# Patient Record
Sex: Female | Born: 1985 | Race: Black or African American | Hispanic: No | Marital: Single | Smoking: Never smoker
Health system: Southern US, Community
[De-identification: ages and names within clinical notes are randomized; demographics above are authoritative.]

## PROBLEM LIST (undated history)

## (undated) DIAGNOSIS — I10 Essential (primary) hypertension: Secondary | ICD-10-CM

---
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
## 2006-01-24 ENCOUNTER — Emergency Department: Payer: Self-pay | Admitting: Emergency Medicine

## 2006-09-05 ENCOUNTER — Emergency Department: Payer: Self-pay | Admitting: Emergency Medicine

## 2006-09-13 ENCOUNTER — Emergency Department: Payer: Self-pay | Admitting: Emergency Medicine

## 2008-02-20 ENCOUNTER — Emergency Department: Payer: Self-pay

## 2013-06-22 ENCOUNTER — Emergency Department: Payer: Self-pay | Admitting: Emergency Medicine

## 2013-06-22 LAB — URINALYSIS, COMPLETE
BACTERIA: NONE SEEN
Bilirubin,UR: NEGATIVE
Blood: NEGATIVE
GLUCOSE, UR: NEGATIVE mg/dL (ref 0–75)
Ketone: NEGATIVE
NITRITE: NEGATIVE
Ph: 5 (ref 4.5–8.0)
Protein: NEGATIVE
RBC,UR: 1 /HPF (ref 0–5)
Specific Gravity: 1.024 (ref 1.003–1.030)

## 2013-06-22 LAB — COMPREHENSIVE METABOLIC PANEL
ALT: 14 U/L (ref 12–78)
Albumin: 2.9 g/dL — ABNORMAL LOW (ref 3.4–5.0)
Alkaline Phosphatase: 64 U/L
Anion Gap: 3 — ABNORMAL LOW (ref 7–16)
BUN: 11 mg/dL (ref 7–18)
Bilirubin,Total: 0.1 mg/dL — ABNORMAL LOW (ref 0.2–1.0)
CHLORIDE: 112 mmol/L — AB (ref 98–107)
CO2: 25 mmol/L (ref 21–32)
CREATININE: 0.8 mg/dL (ref 0.60–1.30)
Calcium, Total: 7.5 mg/dL — ABNORMAL LOW (ref 8.5–10.1)
EGFR (African American): >60
EGFR (Non-African Amer.): >60
Glucose: 91 mg/dL (ref 65–99)
Osmolality: 278 (ref 275–301)
Potassium: 3.8 mmol/L (ref 3.5–5.1)
SGOT(AST): 15 U/L (ref 15–37)
SODIUM: 140 mmol/L (ref 136–145)
Total Protein: 6.7 g/dL (ref 6.4–8.2)

## 2013-06-22 LAB — CBC WITH DIFFERENTIAL/PLATELET
BASOS PCT: 0.6 %
Basophil #: 0 10*3/uL (ref 0.0–0.1)
EOS ABS: 0.1 10*3/uL (ref 0.0–0.7)
Eosinophil %: 0.9 %
HCT: 33.1 % — ABNORMAL LOW (ref 35.0–47.0)
HGB: 10.7 g/dL — AB (ref 12.0–16.0)
Lymphocyte #: 1.7 10*3/uL (ref 1.0–3.6)
Lymphocyte %: 25.9 %
MCH: 27.3 pg (ref 26.0–34.0)
MCHC: 32.5 g/dL (ref 32.0–36.0)
MCV: 84 fL (ref 80–100)
MONO ABS: 0.6 x10 3/mm (ref 0.2–0.9)
MONOS PCT: 9.1 %
Neutrophil #: 4.2 10*3/uL (ref 1.4–6.5)
Neutrophil %: 63.5 %
PLATELETS: 160 10*3/uL (ref 150–440)
RBC: 3.93 10*6/uL (ref 3.80–5.20)
RDW: 15.1 % — ABNORMAL HIGH (ref 11.5–14.5)
WBC: 6.7 10*3/uL (ref 3.6–11.0)

## 2013-06-22 LAB — HCG, QUANTITATIVE, PREGNANCY: BETA HCG, QUANT.: 4601 m[IU]/mL — AB

## 2014-01-10 ENCOUNTER — Observation Stay: Payer: Self-pay

## 2014-01-11 ENCOUNTER — Observation Stay: Payer: Self-pay

## 2014-02-13 ENCOUNTER — Inpatient Hospital Stay: Payer: Self-pay

## 2014-02-13 LAB — CBC WITH DIFFERENTIAL/PLATELET
Basophil #: 0.1 10*3/uL (ref 0.0–0.1)
Basophil %: 1 %
EOS ABS: 0.1 10*3/uL (ref 0.0–0.7)
EOS PCT: 0.9 %
HCT: 36.5 % (ref 35.0–47.0)
HGB: 11.9 g/dL — AB (ref 12.0–16.0)
LYMPHS ABS: 1.7 10*3/uL (ref 1.0–3.6)
Lymphocyte %: 23.1 %
MCH: 28.8 pg (ref 26.0–34.0)
MCHC: 32.5 g/dL (ref 32.0–36.0)
MCV: 89 fL (ref 80–100)
MONO ABS: 0.5 x10 3/mm (ref 0.2–0.9)
Monocyte %: 7.4 %
Neutrophil #: 5 10*3/uL (ref 1.4–6.5)
Neutrophil %: 67.6 %
Platelet: 143 10*3/uL — ABNORMAL LOW (ref 150–440)
RBC: 4.12 10*6/uL (ref 3.80–5.20)
RDW: 13.2 % (ref 11.5–14.5)
WBC: 7.3 10*3/uL (ref 3.6–11.0)

## 2014-02-14 LAB — BASIC METABOLIC PANEL
ANION GAP: 8 (ref 7–16)
BUN: 16 mg/dL (ref 7–18)
CO2: 23 mmol/L (ref 21–32)
Calcium, Total: 8.3 mg/dL — ABNORMAL LOW (ref 8.5–10.1)
Chloride: 110 mmol/L — ABNORMAL HIGH (ref 98–107)
Creatinine: 0.84 mg/dL (ref 0.60–1.30)
EGFR (African American): >60
EGFR (Non-African Amer.): >60
Glucose: 134 mg/dL — ABNORMAL HIGH (ref 65–99)
OSMOLALITY: 284 (ref 275–301)
Potassium: 4 mmol/L (ref 3.5–5.1)
Sodium: 141 mmol/L (ref 136–145)

## 2014-02-14 LAB — DRUG SCREEN, URINE
Amphetamines, Ur Screen: NEGATIVE (ref ?–1000)
BARBITURATES, UR SCREEN: NEGATIVE (ref ?–200)
BENZODIAZEPINE, UR SCRN: NEGATIVE (ref ?–200)
CANNABINOID 50 NG, UR ~~LOC~~: NEGATIVE (ref ?–50)
Cocaine Metabolite,Ur ~~LOC~~: NEGATIVE (ref ?–300)
MDMA (Ecstasy)Ur Screen: POSITIVE (ref ?–500)
METHADONE, UR SCREEN: NEGATIVE (ref ?–300)
Opiate, Ur Screen: NEGATIVE (ref ?–300)
Phencyclidine (PCP) Ur S: NEGATIVE (ref ?–25)
TRICYCLIC, UR SCREEN: NEGATIVE (ref ?–1000)

## 2014-02-14 LAB — URIC ACID: URIC ACID: 6.3 mg/dL — AB (ref 2.6–6.0)

## 2014-02-14 LAB — PROTEIN / CREATININE RATIO, URINE
Creatinine, Urine: 359.3 mg/dL — ABNORMAL HIGH (ref 30.0–125.0)
Protein, Random Urine: 54 mg/dL — ABNORMAL HIGH (ref 0–12)
Protein/Creat. Ratio: 150 mg/gCREAT (ref 0–200)

## 2014-02-14 LAB — SGOT (AST)(ARMC): SGOT(AST): 29 U/L (ref 15–37)

## 2014-02-14 LAB — PLATELET COUNT: PLATELETS: 144 10*3/uL — AB (ref 150–440)

## 2014-02-15 LAB — HEMATOCRIT: HCT: 35 % (ref 35.0–47.0)

## 2014-08-15 NOTE — H&P (Signed)
L&D Evaluation:  History Expanded:  HPI 29 yo AA F w estimated date of confinement 02/18/14 who had nonreactive Non-Stress Test in office; o/w no complaints.  DFM today. Now, patient feels movement and has no contractions or other symptoms.   Presents with decreased fetal movement   Patient's Medical History Obesity   Patient's Surgical History none   Medications Pre Natal Vitamins   Allergies NKDA   Social History none   Family History Non-Contributory   ROS:  ROS All systems were reviewed.  HEENT, CNS, GI, GU, Respiratory, CV, Renal and Musculoskeletal systems were found to be normal.   Exam:  Vital Signs stable   General no apparent distress   Mental Status clear   Abdomen gravid, non-tender   Estimated Fetal Weight Average for gestational age   Edema no edema   FHT normal rate with no decels, +Accels.  R Non-Stress Test   Ucx absent   Impression:  Impression decreased fetal movement, reactive NST   Plan:  Plan EFM/NST   Comments Non-Stress Test w accels, monitored for 30+ MIN, good var, no decels. Instructions gv for home monitoring of movement and labor Fetal Well-being Reassuring   Electronic Signatures: Letitia LibraHarris, Lashaun Krapf Paul (MD)  (Signed 06-Oct-15 16:39)  Authored: L&D Evaluation   Last Updated: 06-Oct-15 16:39 by Letitia LibraHarris, Wylodean Shimmel Paul (MD)

## 2016-04-28 ENCOUNTER — Emergency Department
Admission: EM | Admit: 2016-04-28 | Discharge: 2016-04-28 | Disposition: A | Discharge status: Home / Self Care | Payer: Medicaid Other | Attending: Emergency Medicine | Admitting: Emergency Medicine

## 2016-04-28 ENCOUNTER — Emergency Department: Payer: Medicaid Other

## 2016-04-28 ENCOUNTER — Encounter: Payer: Self-pay | Admitting: Emergency Medicine

## 2016-04-28 DIAGNOSIS — S0003XA Contusion of scalp, initial encounter: Secondary | ICD-10-CM | POA: Diagnosis not present

## 2016-04-28 DIAGNOSIS — F10129 Alcohol abuse with intoxication, unspecified: Secondary | ICD-10-CM | POA: Diagnosis not present

## 2016-04-28 DIAGNOSIS — Y939 Activity, unspecified: Secondary | ICD-10-CM | POA: Insufficient documentation

## 2016-04-28 DIAGNOSIS — S0990XA Unspecified injury of head, initial encounter: Secondary | ICD-10-CM

## 2016-04-28 DIAGNOSIS — W1800XA Striking against unspecified object with subsequent fall, initial encounter: Secondary | ICD-10-CM | POA: Insufficient documentation

## 2016-04-28 DIAGNOSIS — F1210 Cannabis abuse, uncomplicated: Secondary | ICD-10-CM | POA: Diagnosis not present

## 2016-04-28 DIAGNOSIS — R413 Other amnesia: Secondary | ICD-10-CM

## 2016-04-28 DIAGNOSIS — I10 Essential (primary) hypertension: Secondary | ICD-10-CM | POA: Diagnosis not present

## 2016-04-28 DIAGNOSIS — Y929 Unspecified place or not applicable: Secondary | ICD-10-CM | POA: Diagnosis not present

## 2016-04-28 DIAGNOSIS — Z5181 Encounter for therapeutic drug level monitoring: Secondary | ICD-10-CM | POA: Insufficient documentation

## 2016-04-28 DIAGNOSIS — F1310 Sedative, hypnotic or anxiolytic abuse, uncomplicated: Secondary | ICD-10-CM | POA: Insufficient documentation

## 2016-04-28 DIAGNOSIS — W19XXXA Unspecified fall, initial encounter: Secondary | ICD-10-CM

## 2016-04-28 DIAGNOSIS — Y999 Unspecified external cause status: Secondary | ICD-10-CM | POA: Diagnosis not present

## 2016-04-28 DIAGNOSIS — F10929 Alcohol use, unspecified with intoxication, unspecified: Secondary | ICD-10-CM

## 2016-04-28 HISTORY — DX: Essential (primary) hypertension: I10

## 2016-04-28 LAB — URINE DRUG SCREEN, QUALITATIVE (ARMC ONLY)
Amphetamines, Ur Screen: NOT DETECTED
BARBITURATES, UR SCREEN: NOT DETECTED
Benzodiazepine, Ur Scrn: POSITIVE — AB
CANNABINOID 50 NG, UR ~~LOC~~: POSITIVE — AB
COCAINE METABOLITE, UR ~~LOC~~: NOT DETECTED
MDMA (Ecstasy)Ur Screen: NOT DETECTED
METHADONE SCREEN, URINE: NOT DETECTED
OPIATE, UR SCREEN: NOT DETECTED
PHENCYCLIDINE (PCP) UR S: NOT DETECTED
Tricyclic, Ur Screen: NOT DETECTED

## 2016-04-28 LAB — BASIC METABOLIC PANEL
ANION GAP: 10 (ref 5–15)
BUN: 14 mg/dL (ref 6–20)
CALCIUM: 8.5 mg/dL — AB (ref 8.9–10.3)
CHLORIDE: 109 mmol/L (ref 101–111)
CO2: 21 mmol/L — ABNORMAL LOW (ref 22–32)
CREATININE: 0.81 mg/dL (ref 0.44–1.00)
GFR calc non Af Amer: >60 mL/min (ref >60)
Glucose, Bld: 89 mg/dL (ref 65–99)
POTASSIUM: 4.5 mmol/L (ref 3.5–5.1)
SODIUM: 140 mmol/L (ref 135–145)

## 2016-04-28 LAB — ETHANOL: Alcohol, Ethyl (B): 112 mg/dL — ABNORMAL HIGH (ref <5)

## 2016-04-28 LAB — POCT PREGNANCY, URINE: Preg Test, Ur: NEGATIVE

## 2016-04-28 MED ORDER — THIAMINE HCL 100 MG/ML IJ SOLN
Freq: Once | INTRAVENOUS | Status: AC
  Administered 2016-04-28: 11:00:00 via INTRAVENOUS
  Filled 2016-04-28: qty 1000

## 2016-04-28 MED ORDER — SODIUM CHLORIDE 0.9 % IV BOLUS (SEPSIS)
1000.0000 mL | Freq: Once | INTRAVENOUS | Status: AC
  Administered 2016-04-28: 1000 mL via INTRAVENOUS

## 2016-04-28 NOTE — ED Provider Notes (Signed)
[facility] Emergency Department Provider Note  ____________________________________________  Time seen: Approximately 7:55 AM  I have reviewed the triage vital signs and the nursing notes.   HISTORY  Chief Complaint Fall and Alcohol Intoxication    HPI Sandra Mosley is a 31 y.o. female with a history of hypertension presenting with alcohol intoxication and fall with head injury. The patient reports that she had "a few shots and a pint of liquor" overnight. She does not remember the fall "I didn't know I fell onto my girlfriend told me." She has a large contusion to the posterior scalp. At this time, the patient has slurred speech but is oriented x 3.  She denies any other drug use, neck or back pain, numbness tingling or weakness.   Past Medical History:  Diagnosis Date  . Hypertension     There are no active problems to display for this patient.   History reviewed. No pertinent surgical history.    Allergies Patient has no known allergies.  No family history on file.  Social History Social History  Substance Use Topics  . Smoking status: Never Smoker  . Smokeless tobacco: Never Used  . Alcohol use Yes    Review of Systems Constitutional: No fever/chills. Positive fall that the patient cannot recall. Eyes: No visual changes. No blurred or double vision. ENT: No sore throat. No congestion or rhinorrhea. Cardiovascular: Denies chest pain. Denies palpitations. Respiratory: Denies shortness of breath.  No cough. Gastrointestinal: No abdominal pain.  No nausea, no vomiting.  No diarrhea.  No constipation. Genitourinary: Negative for dysuria. Musculoskeletal: Negative for back pain. Negative for neck pain. Skin: Negative for rash. Neurological: Negative for headaches. No focal numbness, tingling or weakness. Mild gait imbalance. Psychiatric:Positive for acute intoxication.  10-point ROS otherwise  negative.  ____________________________________________   PHYSICAL EXAM:  VITAL SIGNS: ED Triage Vitals [04/28/16 0723]  Enc Vitals Group     BP (!) 159/101     Pulse Rate 84     Resp 18     Temp 98.6 F (37 C)     Temp Source Oral     SpO2 100 %     Weight 215 lb (97.5 kg)     Height 5\' 9"  (1.753 m)     Head Circumference      Peak Flow      Pain Score      Pain Loc      Pain Edu?      Excl. in GC?     Constitutional: Alert and oriented x3. GCS is 15. The patient is intoxicated with mildly slurred speech but is able to answer all questions appropriately. She is not in any acute distress at this time. Eyes: Conjunctivae are normal.  EOMI. PERRLA. No raccoon eyes. No scleral icterus. Head: 3 x 3 cm contusion on the posterior scalp with no obvious laceration but there is blood on the posterior scalp. The blood may be from an abrasion, but the patient has very thick hair which may impede visualization of a small laceration. No Battle sign. Nose: No congestion/rhinnorhea. No swelling over the nose or septal hematoma. Mouth/Throat: Mucous membranes are moist. No dental injury or malocclusion. Neck: No stridor.  Supple.  No midline C-spine tenderness to palpation, step-offs or deformities. Cardiovascular: Normal rate, regular rhythm. No murmurs, rubs or gallops.  Respiratory: Normal respiratory effort.  No accessory muscle use or retractions. Lungs CTAB.  No wheezes, rales or ronchi. Gastrointestinal: Soft, nontender and nondistended.  No  guarding or rebound.  No peritoneal signs. Musculoskeletal: Moves all extremities well. No midline thoracic or lumbar spine tenderness to palpation, step-offs or deformities Neurologic:  A&Ox3.  Speech is mildly slurred..  Face and smile are symmetric.  EOMI.  Moves all extremities well. Skin:  Skin is warm, dry. No rash noted. Psychiatric: Mood and affect are normal.   ____________________________________________   LABS (all labs ordered are  listed, but only abnormal results are displayed)  Labs Reviewed  BASIC METABOLIC PANEL - Abnormal; Notable for the following:       Result Value   CO2 21 (*)    Calcium 8.5 (*)    All other components within normal limits  ETHANOL - Abnormal; Notable for the following:    Alcohol, Ethyl (B) 112 (*)    All other components within normal limits  URINE DRUG SCREEN, QUALITATIVE ([facility] ONLY) - Abnormal; Notable for the following:    Cannabinoid 50 Ng, Ur [place] POSITIVE (*)    Benzodiazepine, Ur Scrn POSITIVE (*)    All other components within normal limits  CBC  POC URINE PREG, ED  POCT PREGNANCY, URINE   ____________________________________________  EKG  Not indicated ____________________________________________  RADIOLOGY  Ct Head Wo Contrast  Result Date: 04/28/2016 CLINICAL DATA:  Pain following fall EXAM: CT HEAD WITHOUT CONTRAST CT CERVICAL SPINE WITHOUT CONTRAST TECHNIQUE: Multidetector CT imaging of the head and cervical spine was performed following the standard protocol without intravenous contrast. Multiplanar CT image reconstructions of the cervical spine were also generated. COMPARISON:  None. FINDINGS: CT HEAD FINDINGS Brain: The ventricles are mildly prominent for age in a generalized manner. Sulci appear prominent. There is no intracranial mass hemorrhage, extra-axial fluid collection, or midline shift. A small amount of basal ganglia calcification is noted on each side. Beyond this basal ganglia calcification, gray-white compartments appear normal. There is no demonstrable acute infarct. Vascular: No hyperdense vessel. There is no demonstrable vascular calcification. Skull: Bony calvarium appears intact. Sinuses/Orbits: Visualized paranasal sinuses are clear. Visualized orbits appear symmetric bilaterally. Other: Mastoids appear essentially aplastic bilaterally. CT CERVICAL SPINE FINDINGS Alignment: There is cervical levoscoliosis. There is no spondylolisthesis. Skull base and  vertebrae: Skull base and craniocervical junction regions appear normal. There is no evident acute fracture. No blastic or lytic bone lesions are apparent. Slight remodeling of the posterior aspect of the C4 vertebral body raises question of old injury in this area. Soft tissues and spinal canal: Prevertebral soft tissues and predental space regions are normal. There is no demonstrable cord or canal hematoma. No spinal stenosis evident. No paraspinous lesions. Disc levels: There is mild disc space narrowing at C6-7. There are anterior osteophytes at C5 and C6 with calcification in the anterior longitudinal ligament at C5-6. There is no evident disc extrusion. No significant exit foraminal narrowing noted on the study. Upper chest: Visualized upper lung regions appear normal. Other: None IMPRESSION: CT head: Ventricles are somewhat prominent for age in a generalized manner. Etiology and significance of this finding is uncertain. Nonemergent brain MR with particular attention to the ventricles may be reasonable. There is mild basal ganglia calcification which is potentially physiologic but usually seen an older age group. Multiple metabolic lesions can lead to basal ganglia calcification. As several of those lesions involve parathyroid abnormality, correlation with serum calcium level may be reasonable. There is no intracranial mass, hemorrhage, or extra-axial fluid collection. No focal gray-white compartment lesions identified beyond the mild basal ganglia calcification. Note that mastoid air cells are essentially aplastic. CT  cervical spine: Levoscoliosis. Question old trauma with remodeling involving the C4 vertebral body along its posterior aspect. No acute fracture. No spondylolisthesis. Mild osteoarthritic change at C5-6 and C6-7. Electronically Signed   By: Bretta Bang III M.D.   On: 04/28/2016 09:47   Ct Cervical Spine Wo Contrast  Result Date: 04/28/2016 CLINICAL DATA:  Pain following fall EXAM: CT  HEAD WITHOUT CONTRAST CT CERVICAL SPINE WITHOUT CONTRAST TECHNIQUE: Multidetector CT imaging of the head and cervical spine was performed following the standard protocol without intravenous contrast. Multiplanar CT image reconstructions of the cervical spine were also generated. COMPARISON:  None. FINDINGS: CT HEAD FINDINGS Brain: The ventricles are mildly prominent for age in a generalized manner. Sulci appear prominent. There is no intracranial mass hemorrhage, extra-axial fluid collection, or midline shift. A small amount of basal ganglia calcification is noted on each side. Beyond this basal ganglia calcification, gray-white compartments appear normal. There is no demonstrable acute infarct. Vascular: No hyperdense vessel. There is no demonstrable vascular calcification. Skull: Bony calvarium appears intact. Sinuses/Orbits: Visualized paranasal sinuses are clear. Visualized orbits appear symmetric bilaterally. Other: Mastoids appear essentially aplastic bilaterally. CT CERVICAL SPINE FINDINGS Alignment: There is cervical levoscoliosis. There is no spondylolisthesis. Skull base and vertebrae: Skull base and craniocervical junction regions appear normal. There is no evident acute fracture. No blastic or lytic bone lesions are apparent. Slight remodeling of the posterior aspect of the C4 vertebral body raises question of old injury in this area. Soft tissues and spinal canal: Prevertebral soft tissues and predental space regions are normal. There is no demonstrable cord or canal hematoma. No spinal stenosis evident. No paraspinous lesions. Disc levels: There is mild disc space narrowing at C6-7. There are anterior osteophytes at C5 and C6 with calcification in the anterior longitudinal ligament at C5-6. There is no evident disc extrusion. No significant exit foraminal narrowing noted on the study. Upper chest: Visualized upper lung regions appear normal. Other: None IMPRESSION: CT head: Ventricles are somewhat  prominent for age in a generalized manner. Etiology and significance of this finding is uncertain. Nonemergent brain MR with particular attention to the ventricles may be reasonable. There is mild basal ganglia calcification which is potentially physiologic but usually seen an older age group. Multiple metabolic lesions can lead to basal ganglia calcification. As several of those lesions involve parathyroid abnormality, correlation with serum calcium level may be reasonable. There is no intracranial mass, hemorrhage, or extra-axial fluid collection. No focal gray-white compartment lesions identified beyond the mild basal ganglia calcification. Note that mastoid air cells are essentially aplastic. CT cervical spine: Levoscoliosis. Question old trauma with remodeling involving the C4 vertebral body along its posterior aspect. No acute fracture. No spondylolisthesis. Mild osteoarthritic change at C5-6 and C6-7. Electronically Signed   By: Bretta Bang III M.D.   On: 04/28/2016 09:47    ____________________________________________   PROCEDURES  Procedure(s) performed: None  Procedures  Critical Care performed: No ____________________________________________   INITIAL IMPRESSION / ASSESSMENT AND PLAN / ED COURSE  Pertinent labs & imaging results that were available during my care of the patient were reviewed by me and considered in my medical decision making (see chart for details).  31 y.o. female who is grossly intoxicated with a fall that she does not remember, resulting posterior scalp contusion. Overall, I am concerned that the patient's examination is unreliable due to her level of intoxication. She'll receive a CT head and C-spine, and a [place] collar has been placed immediately. Drug screening to look  for any concurrent drug abuse. The patient will require ED stay to be monitored until she is sober, even if her trauma workup is negative.  I was called to the bedside because the  patient was found on the floor. Both her bed rails had been placed and she had been told not to leave the bed, as she stated that she needed to urinate. She has no other new obvious injuries, and was assisted back into the bed. At this time, the door remains open and I have again asked the patient to call for help if for any reason she needs to leave the bed.  ----------------------------------------- 10:24 AM on 04/28/2016 -----------------------------------------  The patient is positive for alcohol with a level of 112, cannabinoids, and benzodiazepines. She has been resting comfortably and has a CT scan which shows mildly enlarged ventricles for her age, which will require outpatient follow-up and I will tell her about this, as well as type bit into her discharge paperwork. She does not have any acute fracture in her spine and will be clinically cleared from her collar when she is clinically sober. Once the patient is clinically sober, she will be discharged home with close PMD follow-up.  At this time, the patient is still too sleepy for evaluation and discharge.  ----------------------------------------- 12:03 PM on 04/28/2016 -----------------------------------------  The patient has been clinically cleared from her collar. She is alert and oriented 3. We'll sit her up, give her something to eat and drink, and if she is able to walk in a stable manner, plan discharge. I have reevaluated her scalp and still do not see an acute laceration, just an abrasion. ____________________________________________  FINAL CLINICAL IMPRESSION(S) / ED DIAGNOSES  Final diagnoses:  Alcoholic intoxication with complication (HCC)  Traumatic injury of head, initial encounter  Fall, initial encounter  Marijuana abuse  Benzodiazepine abuse  Amnesia         NEW MEDICATIONS STARTED DURING THIS VISIT:  New Prescriptions   No medications on file      Rockne Menghini, MD 04/28/16 1203

## 2016-04-28 NOTE — ED Notes (Signed)
[employer] called, ETA 45 min

## 2016-04-28 NOTE — ED Triage Notes (Addendum)
Pt to ed via ems with reports of having a fall sometime last night and hitting the back of head. Pt intoxicated at time of fall and presents today with noted intoxication. Pt alert and oriented by having a hard time staying awake. Hematoma with lac noted to back of head. Pressure dressing applied. Ems reports vss. Pt reports feeling dizzy.

## 2016-04-28 NOTE — ED Notes (Signed)
Attempted to contact pts emergency contact, no answer

## 2016-04-28 NOTE — ED Notes (Addendum)
Pt ambulated in hallway w/o fall, some balance issue.  Continues to state no one can pick her up

## 2016-04-28 NOTE — ED Notes (Signed)
Pt placed in stretcher by 2 RNs, pt intoxicated and unsteady. Registration arrived to find pt moving about on the floor "I have to pee", Dr.Norman and RN Tobi Bastosnna aware

## 2016-04-28 NOTE — ED Notes (Signed)
Pt sitting up and given cup of water to drink.  Pt sts that she will try to remember phone number of "baby daddy" to come pick her up.  No phone found in pts belongings

## 2016-04-28 NOTE — ED Notes (Signed)
Pt urinated in toilet.  Unsteady on feet to toilet. Pt moved into hall and given phone to call ride.

## 2016-04-28 NOTE — Discharge Instructions (Signed)
Return to the emergency department for severe pain, vomiting, new numbness tingling or weakness, thoughts of hurting you're self or anyone else, or any other symptoms concerning to you.

## 2016-04-28 NOTE — ED Notes (Signed)
Pts family called to ask if pt needed transportation.  Sated that pt did, called and cancelled cab.

## 2016-04-28 NOTE — ED Notes (Addendum)
Attempted to walk pt, pt unsteady on feet did not fall. Left message w/ pts listed emergency contact.  Pt unable to remember any phone numbers at this time.  Wound cleaned

## 2016-04-28 NOTE — ED Notes (Signed)
Pt ambulated in hallway.  Pt sts that she feels stable.  Pt able to walk without issue.

## 2018-06-24 IMAGING — CT CT CERVICAL SPINE W/O CM
3 of 7 series · 11 of 33 positions shown, 12 images · non-contrast
Comparison: None.

CLINICAL DATA: Pain following fall

EXAM:
CT HEAD WITHOUT CONTRAST
CT CERVICAL SPINE WITHOUT CONTRAST
TECHNIQUE: Multidetector CT imaging of the head and cervical spine was
performed following the standard protocol without intravenous
contrast. Multiplanar CT image reconstructions of the cervical spine
were also generated.

[Series 4: coronal soft tissue · coronal · 0.32mm/px · 3 of 67 slices shown]
[im 17/67  bone]
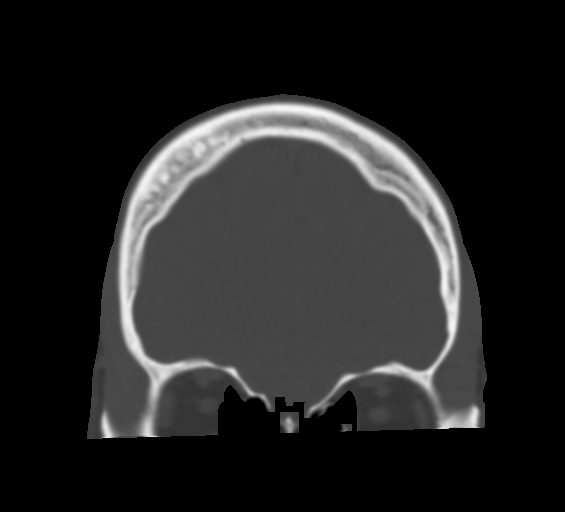
[im 34/67  bone]
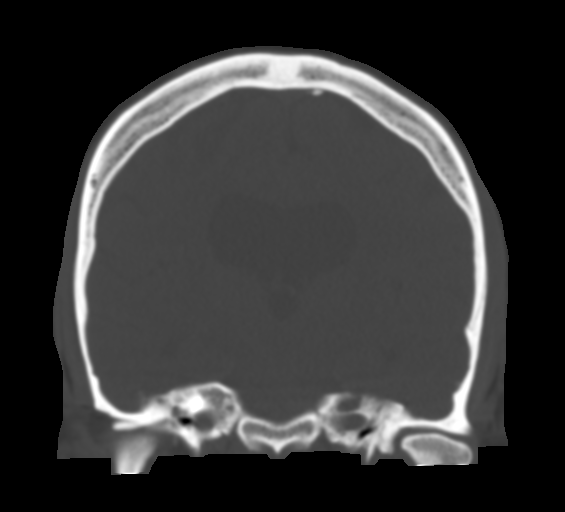
[im 50/67  bone]
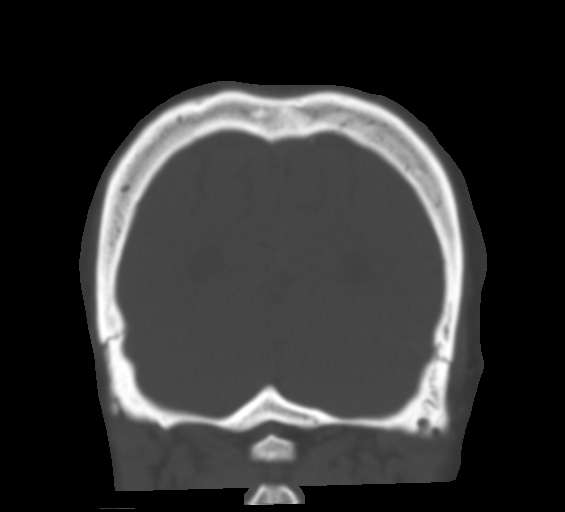

[Series 10: sagittal bone · sagittal · 0.22mm/px · 4 of 48 slices shown]
[im 10/48  bone]
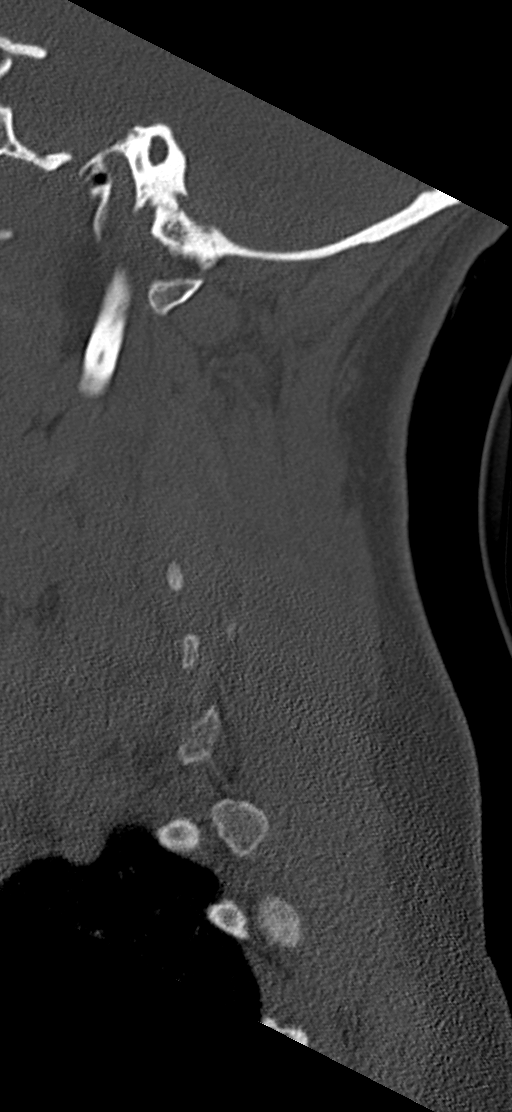
[im 19/48  bone]
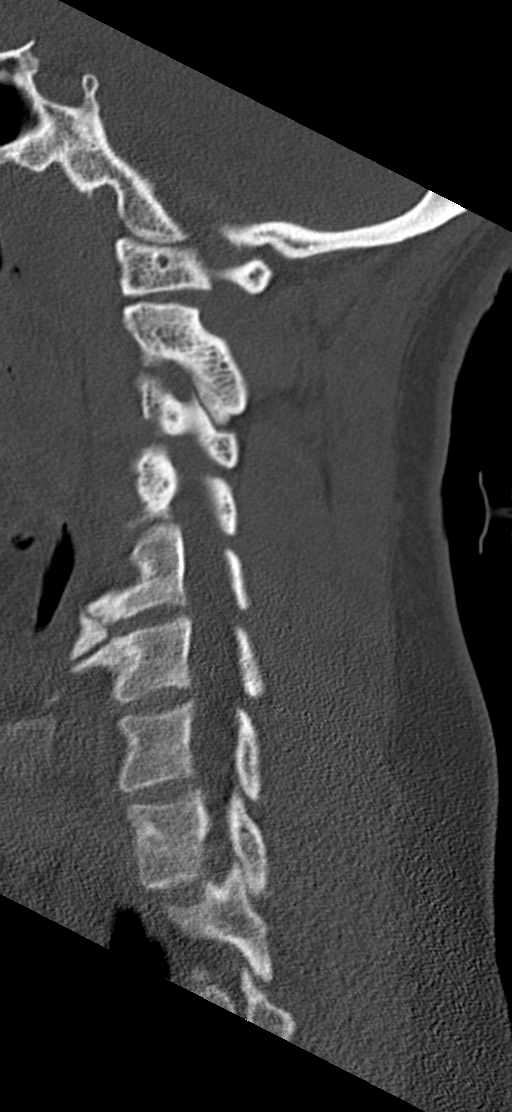
[im 29/48  bone]
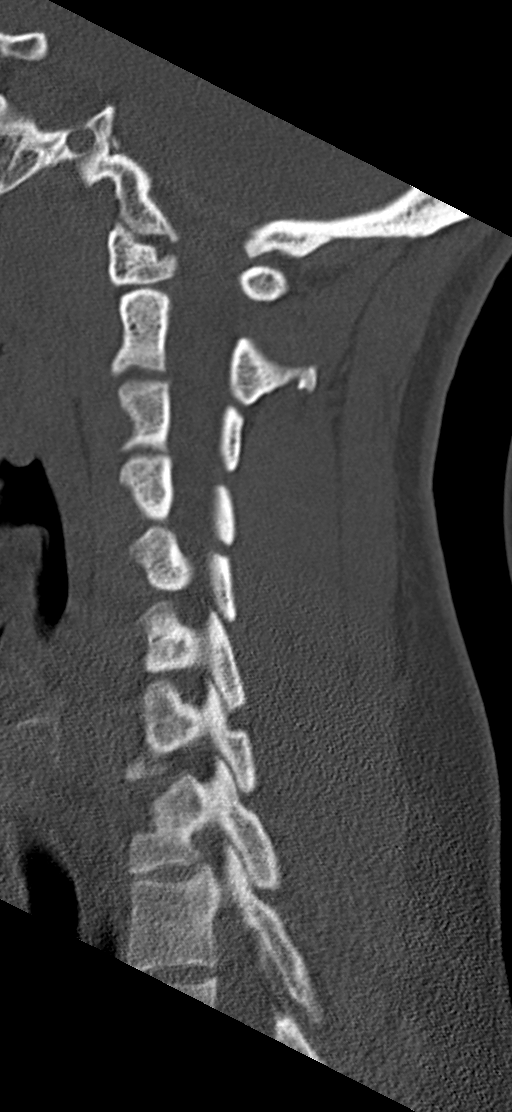
[im 38/48  bone]
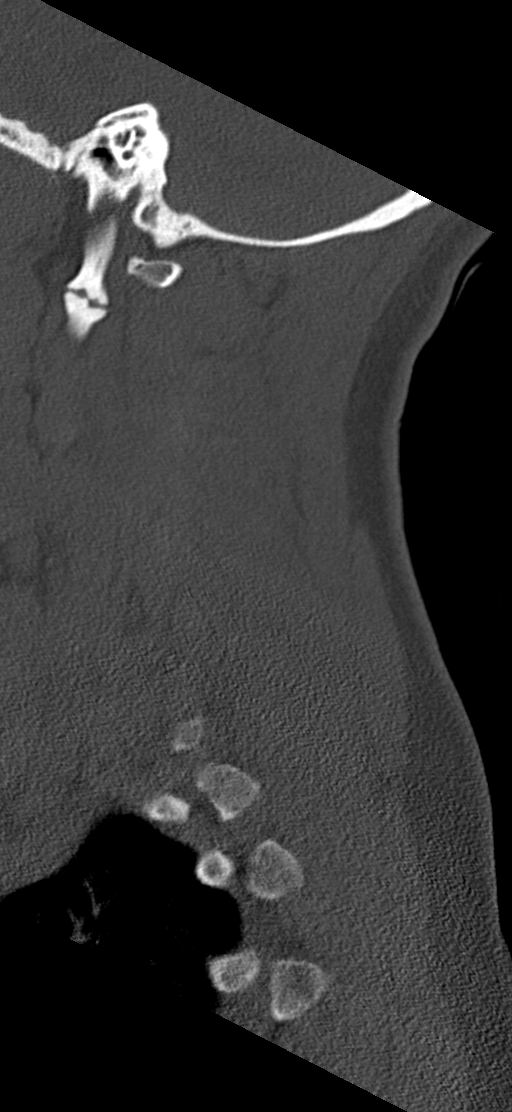

[Series 12: orthogonal bone · axial · 0.23mm/px · z∈[+303,+428]mm · 4 of 109 slices shown, 5 images]
[im 19/109  soft-tissue]
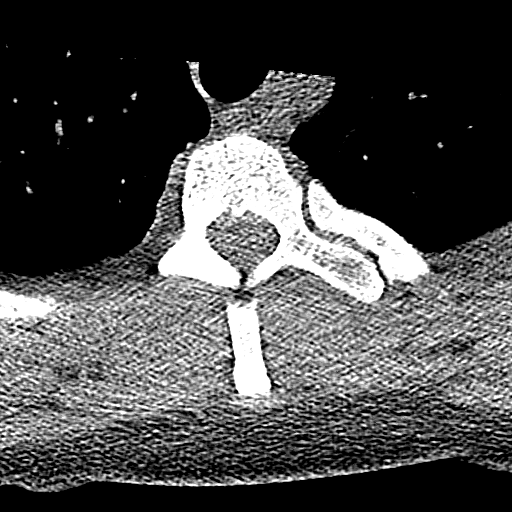
[im 19/109  bone]
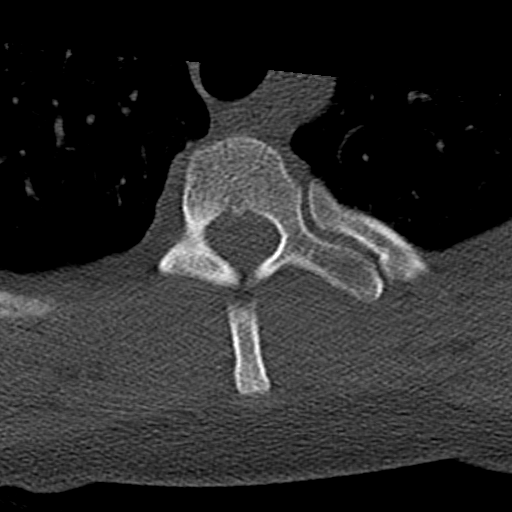
[im 37/109  bone]
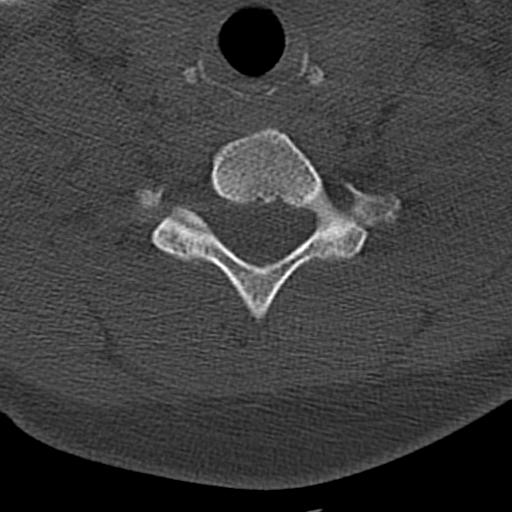
[im 73/109  bone]
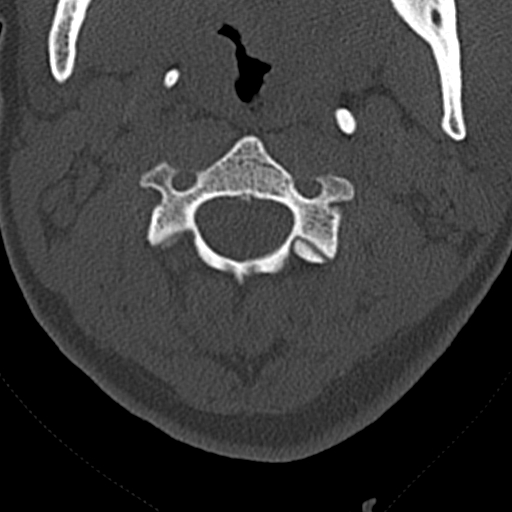
[im 91/109  bone]
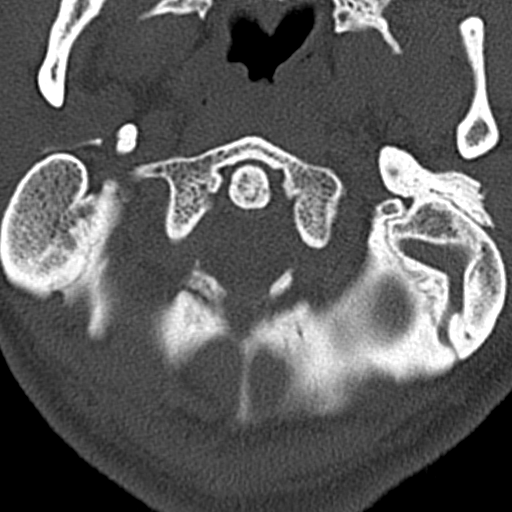

[11 of 33 positions shown; findings below may reference images not displayed]

FINDINGS: CT HEAD FINDINGS

Brain: The ventricles are mildly prominent for age in a generalized
manner. Sulci appear prominent. There is no intracranial mass
hemorrhage, extra-axial fluid collection, or midline shift. A small
amount of basal ganglia calcification is noted on each side. Beyond
this basal ganglia calcification, gray-white compartments appear
normal. There is no demonstrable acute infarct.

Vascular: No hyperdense vessel. There is no demonstrable vascular
calcification.

Skull: Bony calvarium appears intact.

Sinuses/Orbits: Visualized paranasal sinuses are clear. Visualized
orbits appear symmetric bilaterally.

Other: Mastoids appear essentially aplastic bilaterally.

CT CERVICAL SPINE FINDINGS

Alignment: There is cervical levoscoliosis. There is no
spondylolisthesis.

Skull base and vertebrae: Skull base and craniocervical junction
regions appear normal. There is no evident acute fracture. No
blastic or lytic bone lesions are apparent. Slight remodeling of the
posterior aspect of the C4 vertebral body raises question of old
injury in this area.

Soft tissues and spinal canal: Prevertebral soft tissues and
predental space regions are normal. There is no demonstrable cord or
canal hematoma. No spinal stenosis evident. No paraspinous lesions.

Disc levels: There is mild disc space narrowing at C6-7. There are
anterior osteophytes at C5 and C6 with calcification in the anterior
longitudinal ligament at C5-6. There is no evident disc extrusion.
No significant exit foraminal narrowing noted on the study.

Upper chest: Visualized upper lung regions appear normal.

Other: None
IMPRESSION: CT head: Ventricles are somewhat prominent for age in a generalized
manner. Etiology and significance of this finding is uncertain.
Nonemergent brain MR with particular attention to the ventricles may
be reasonable. There is mild basal ganglia calcification which is
potentially physiologic but usually seen an older age group.
Multiple metabolic lesions can lead to basal ganglia calcification.
As several of those lesions involve parathyroid abnormality,
correlation with serum calcium level may be reasonable. There is no
intracranial mass, hemorrhage, or extra-axial fluid collection. No
focal gray-white compartment lesions identified beyond the mild
basal ganglia calcification. Note that mastoid air cells are
essentially aplastic.

CT cervical spine: Levoscoliosis. Question old trauma with
remodeling involving the C4 vertebral body along its posterior
aspect. No acute fracture. No spondylolisthesis. Mild osteoarthritic
change at C5-6 and C6-7.

## 2020-09-05 ENCOUNTER — Other Ambulatory Visit: Payer: Self-pay

## 2020-09-05 ENCOUNTER — Emergency Department: Payer: Medicaid Other

## 2020-09-05 DIAGNOSIS — S92325A Nondisplaced fracture of second metatarsal bone, left foot, initial encounter for closed fracture: Secondary | ICD-10-CM | POA: Insufficient documentation

## 2020-09-05 DIAGNOSIS — W010XXA Fall on same level from slipping, tripping and stumbling without subsequent striking against object, initial encounter: Secondary | ICD-10-CM | POA: Insufficient documentation

## 2020-09-05 DIAGNOSIS — I10 Essential (primary) hypertension: Secondary | ICD-10-CM | POA: Diagnosis not present

## 2020-09-05 DIAGNOSIS — S99922A Unspecified injury of left foot, initial encounter: Secondary | ICD-10-CM | POA: Diagnosis present

## 2020-09-05 NOTE — ED Triage Notes (Signed)
Pt tripped on rug and is now co left foot pain. Pt came from home by [facility].

## 2020-09-06 ENCOUNTER — Emergency Department
Admission: EM | Admit: 2020-09-06 | Discharge: 2020-09-06 | Disposition: A | Discharge status: Home / Self Care | Payer: Medicaid Other | Attending: Emergency Medicine | Admitting: Emergency Medicine

## 2020-09-06 DIAGNOSIS — S92325A Nondisplaced fracture of second metatarsal bone, left foot, initial encounter for closed fracture: Secondary | ICD-10-CM

## 2020-09-06 DIAGNOSIS — W19XXXA Unspecified fall, initial encounter: Secondary | ICD-10-CM

## 2020-09-06 MED ORDER — OXYCODONE-ACETAMINOPHEN 5-325 MG PO TABS: 1.0000 | ORAL_TABLET | ORAL | 0 refills | Status: AC | PRN

## 2020-09-06 MED ORDER — IBUPROFEN 800 MG PO TABS: 800.0000 mg | ORAL_TABLET | Freq: Three times a day (TID) | ORAL | 0 refills | Status: AC | PRN

## 2020-09-06 MED ORDER — OXYCODONE-ACETAMINOPHEN 5-325 MG PO TABS
1.0000 | ORAL_TABLET | Freq: Once | ORAL | Status: AC
  Administered 2020-09-06: 1 via ORAL
  Filled 2020-09-06: qty 1

## 2020-09-06 MED ORDER — IBUPROFEN 400 MG PO TABS
600.0000 mg | ORAL_TABLET | Freq: Once | ORAL | Status: AC
  Administered 2020-09-06: 600 mg via ORAL
  Filled 2020-09-06: qty 2

## 2020-09-06 NOTE — ED Provider Notes (Signed)
[facility] Emergency Department Provider Note  ____________________________________________   Event Date/Time   First MD Initiated Contact with Patient 09/06/20 0110     (approximate)  I have reviewed the triage vital signs and the nursing notes.   HISTORY  Chief Complaint Fall   HPI Sandra Mosley is a 35 y.o. female who presents to the ED from home s/p mechanical fall with left foot pain and swelling. Denies striking head or LOC. Complains of pain and swelling to her left foot; unable to bear weight secondary to pain.     Past Medical History:  Diagnosis Date  . Hypertension     There are no problems to display for this patient.   No past surgical history on file.  Prior to Admission medications   Medication Sig Start Date End Date Taking? Authorizing Provider  ibuprofen (ADVIL) 800 MG tablet Take 1 tablet (800 mg total) by mouth every 8 (eight) hours as needed for moderate pain. 09/06/20  Yes Irean Hong, MD  oxyCODONE-acetaminophen (PERCOCET/ROXICET) 5-325 MG tablet Take 1 tablet by mouth every 4 (four) hours as needed for severe pain. 09/06/20  Yes Irean Hong, MD    Allergies Patient has no known allergies.  No family history on file.  Social History Social History   Tobacco Use  . Smoking status: Never Smoker  . Smokeless tobacco: Never Used  Substance Use Topics  . Alcohol use: Yes    Review of Systems  Constitutional: No fever/chills Eyes: No visual changes. ENT: No sore throat. Cardiovascular: Denies chest pain. Respiratory: Denies shortness of breath. Gastrointestinal: No abdominal pain.  No nausea, no vomiting.  No diarrhea.  No constipation. Genitourinary: Negative for dysuria. Musculoskeletal: Positive for left foot pain. Negative for back pain. Skin: Negative for rash. Neurological: Negative for headaches, focal weakness or numbness.   ____________________________________________   PHYSICAL EXAM:  VITAL  SIGNS: ED Triage Vitals [09/05/20 2205]  Enc Vitals Group     BP 136/82     Pulse Rate (!) 111     Resp 20     Temp 98.5 F (36.9 C)     Temp Source Oral     SpO2 100 %     Weight 215 lb (97.5 kg)     Height 5\' 7"  (1.702 m)     Head Circumference      Peak Flow      Pain Score 8     Pain Loc      Pain Edu?      Excl. in GC?     Constitutional: Alert and oriented. Well appearing and in mild acute distress. Eyes: Conjunctivae are normal. PERRL. EOMI. Head: Atraumatic. Nose: Atraumatic. Mouth/Throat: Mucous membranes are moist.  No dental malocclusion. Neck: No stridor.  No cervical spine tenderness to palpation. Cardiovascular: Normal rate, regular rhythm. Grossly normal heart sounds.  Good peripheral circulation. Respiratory: Normal respiratory effort.  No retractions. Lungs CTAB. Gastrointestinal: Soft and nontender. No distention. No abdominal bruits. No CVA tenderness. Musculoskeletal: Left dorsal foot with mild swelling and tenderness to palpation. Limit ROM secondary to pain. 2+ distal pulses. Brisk, less than 5 second capillary refill. Neurologic:  Normal speech and language. No gross focal neurologic deficits are appreciated.  Skin:  Skin is warm, dry and intact. No rash noted. Psychiatric: Mood and affect are normal. Speech and behavior are normal.  ____________________________________________   LABS (all labs ordered are listed, but only abnormal results are displayed)  Labs Reviewed -  No data to display ____________________________________________  EKG  None ____________________________________________  RADIOLOGY I, Virgilene Stryker J, personally viewed and evaluated these images (plain radiographs) as part of my medical decision making, as well as reviewing the written report by the radiologist.  ED MD interpretation:  2nd and possible 3rd metatarsal fx  Official radiology report(s): DG Foot Complete Left  Result Date: 09/05/2020 CLINICAL DATA:  Trip and fall  with left foot pain, initial encounter EXAM: LEFT FOOT - COMPLETE 3+ VIEW COMPARISON:  None. FINDINGS: There is fracture at the base of the second metatarsal without significant displacement. Mild irregularity of the oblique image is noted in the base of the third metatarsal suspicious for undisplaced fracture. No Lisfranc abnormality is noted. Mild soft tissue swelling is seen. IMPRESSION: Fracture at the base of the second metatarsal with findings suspicious for fracture at the base of the third metatarsal. Electronically Signed   By: Alcide Clever M.D.   On: 09/05/2020 22:22    ____________________________________________   PROCEDURES  Procedure(s) performed (including Critical Care):  Procedures   ____________________________________________   INITIAL IMPRESSION / ASSESSMENT AND PLAN / ED COURSE  As part of my medical decision making, I reviewed the following data within the electronic MEDICAL RECORD NUMBER Nursing notes reviewed and incorporated, Old chart reviewed, Radiograph reviewed, Notes from prior ED visits and [place] Controlled Substance Database     35 year old female who presents s/p fall with left foot pain and swelling. Metatarsal fractures seen on xray; no [employer]. Will place in posterior OCL splint, advised NWB, crutches, analgesia and podiatry follow up. Strict return precautions given. Patient verbalizes understanding and agrees with plan of care.      ____________________________________________   FINAL CLINICAL IMPRESSION(S) / ED DIAGNOSES  Final diagnoses:  Closed nondisplaced fracture of second metatarsal bone of left foot, initial encounter  Fall, initial encounter     ED Discharge Orders         Ordered    ibuprofen (ADVIL) 800 MG tablet  Every 8 hours PRN        09/06/20 0145    oxyCODONE-acetaminophen (PERCOCET/ROXICET) 5-325 MG tablet  Every 4 hours PRN        09/06/20 0145           Note:  This document was prepared using Dragon voice recognition  software and may include unintentional dictation errors.   Irean Hong, MD 09/06/20 [phone]

## 2020-09-06 NOTE — Discharge Instructions (Addendum)
You may take pain medicines as needed (Motrin/Percocet). Keep splint clean and dry. Elevate left foot and apply ice over splint x 3 days. Use crutches to walk. Do not bear weight on left foot. Return to the ER for worsening symptoms, persistent vomiting, difficulty breathing or other concerns.

## 2022-02-25 ENCOUNTER — Ambulatory Visit: Payer: Medicaid Other

## 2022-11-01 IMAGING — CR DG FOOT COMPLETE 3+V*L*
1 series · 3 of 3 positions shown · non-contrast
Comparison: None.

CLINICAL DATA: Trip and fall with left foot pain, initial encounter

EXAM:
LEFT FOOT - COMPLETE 3+ VIEW

[Series 1: dg foot complete left · 0.14mm/px · 3 of 3 slices shown]
[im 1/3]
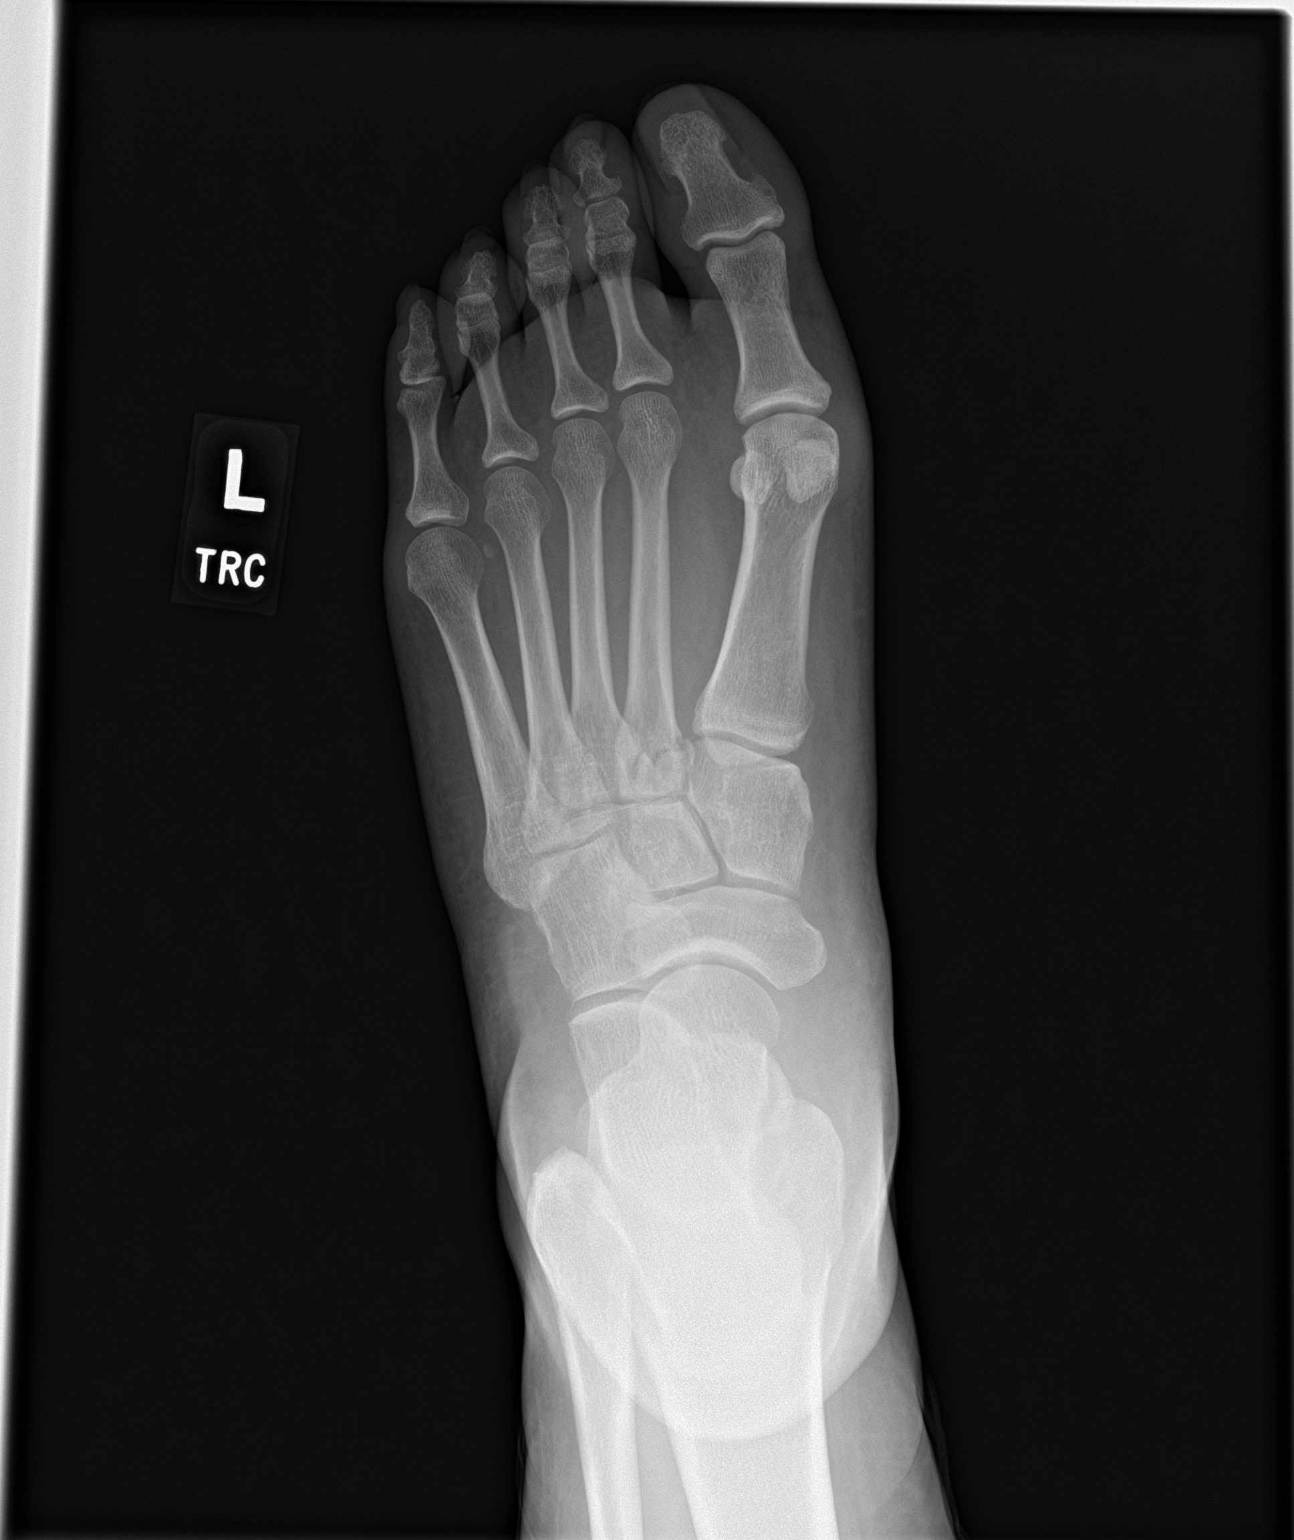
[im 2/3]
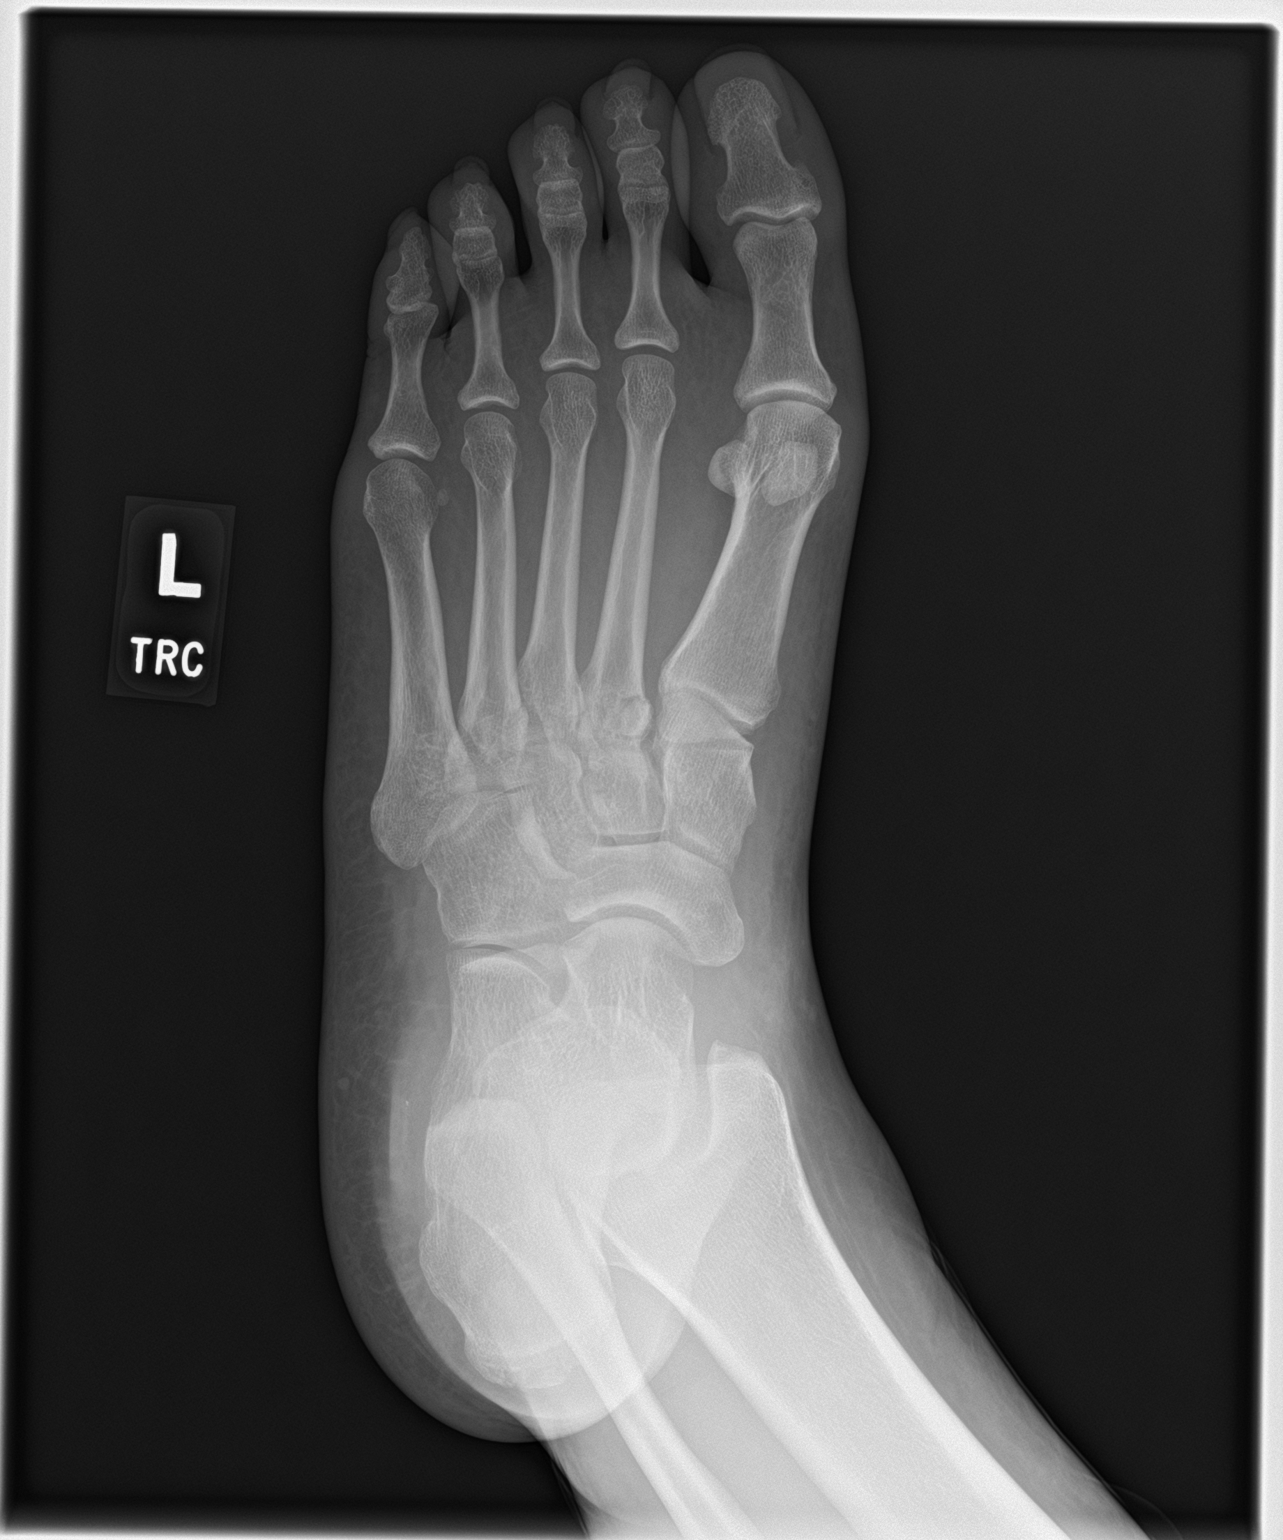
[im 3/3]
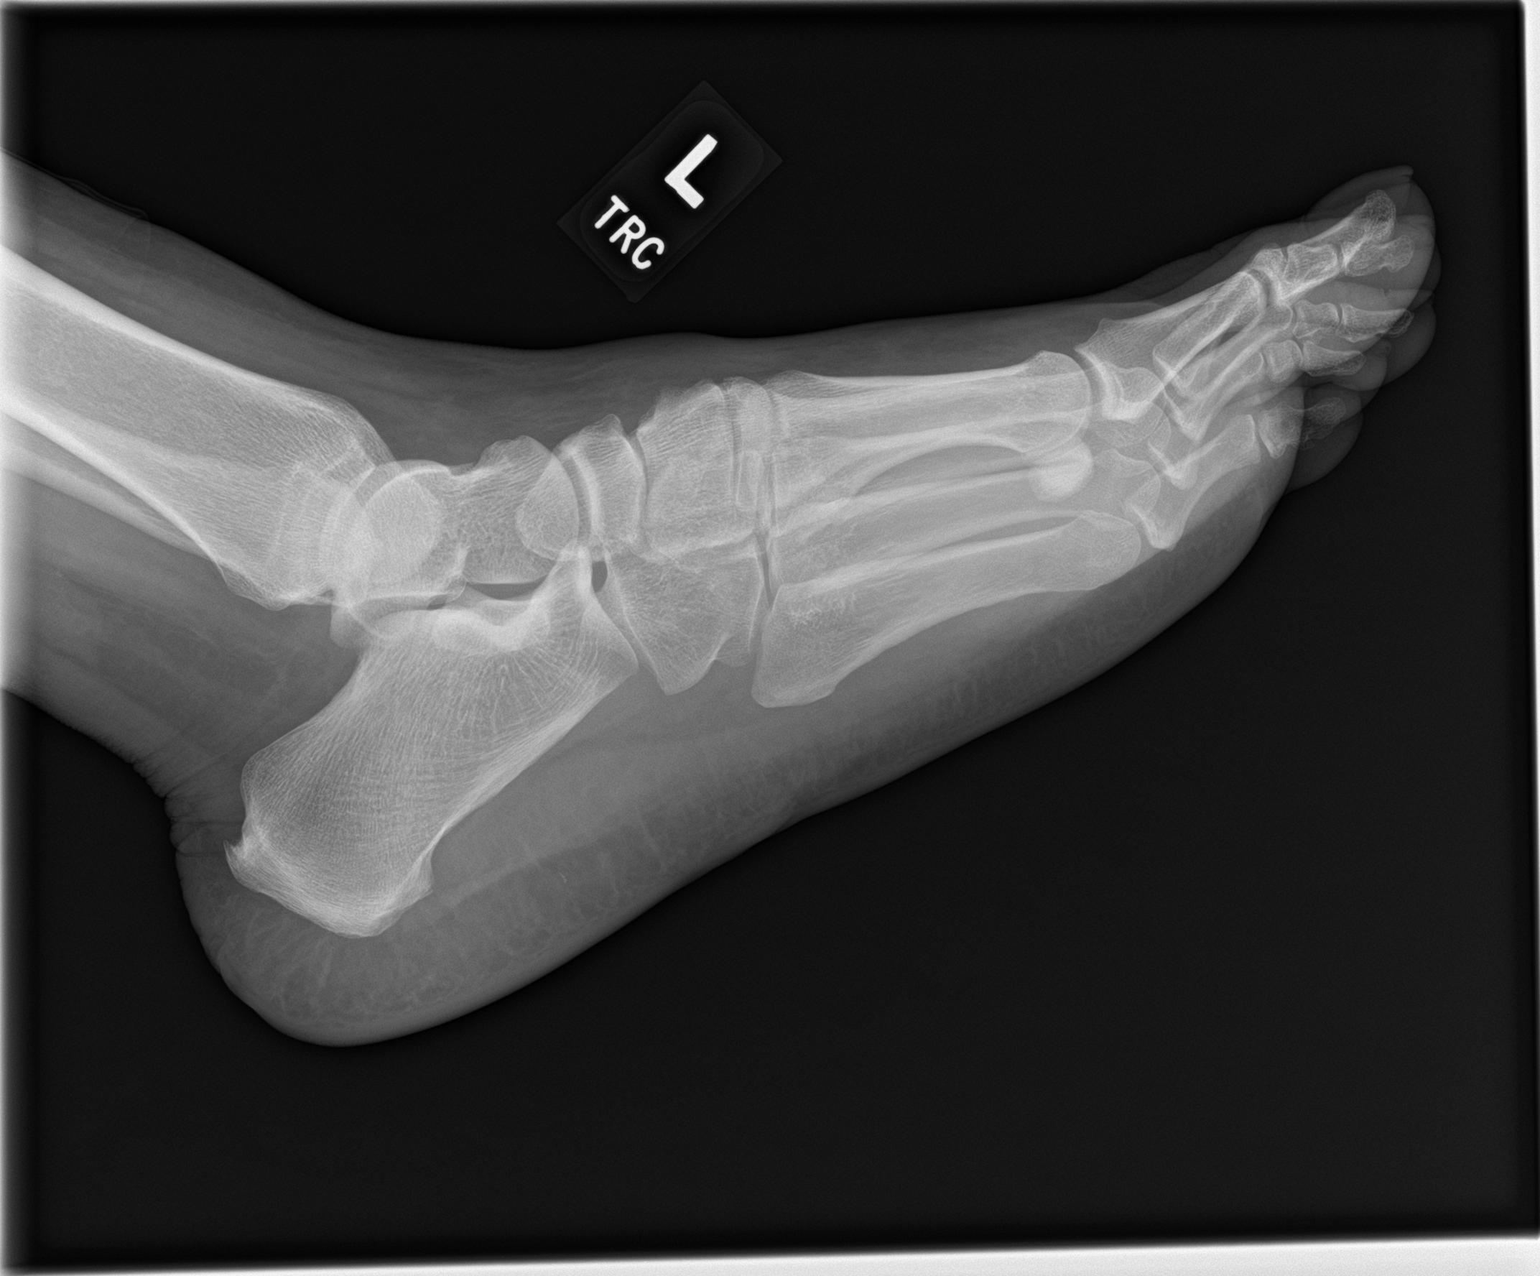

[3 of 3 positions shown; findings below may reference images not displayed]

FINDINGS: There is fracture at the base of the second metatarsal without
significant displacement. Mild irregularity of the oblique image is
noted in the base of the third metatarsal suspicious for undisplaced
fracture. No Lisfranc abnormality is noted. Mild soft tissue
swelling is seen.
IMPRESSION: Fracture at the base of the second metatarsal with findings
suspicious for fracture at the base of the third metatarsal.
